# Patient Record
Sex: Male | Born: 1980 | Hispanic: No | Marital: Single | Smoking: Never smoker
Health system: Southern US, Community
[De-identification: ages and names within clinical notes are randomized; demographics above are authoritative.]

---

## 2014-06-09 ENCOUNTER — Encounter (HOSPITAL_COMMUNITY): Payer: Self-pay | Admitting: Emergency Medicine

## 2014-06-09 ENCOUNTER — Emergency Department (HOSPITAL_COMMUNITY)
Admission: EM | Admit: 2014-06-09 | Discharge: 2014-06-09 | Disposition: A | Discharge status: Home / Self Care | Payer: No Typology Code available for payment source | Attending: Emergency Medicine | Admitting: Emergency Medicine

## 2014-06-09 DIAGNOSIS — S0180XA Unspecified open wound of other part of head, initial encounter: Secondary | ICD-10-CM | POA: Diagnosis not present

## 2014-06-09 DIAGNOSIS — S0181XA Laceration without foreign body of other part of head, initial encounter: Secondary | ICD-10-CM

## 2014-06-09 DIAGNOSIS — Y92410 Unspecified street and highway as the place of occurrence of the external cause: Secondary | ICD-10-CM | POA: Insufficient documentation

## 2014-06-09 DIAGNOSIS — Y9389 Activity, other specified: Secondary | ICD-10-CM | POA: Insufficient documentation

## 2014-06-09 MED ORDER — LIDOCAINE HCL 1 % IJ SOLN
INTRAMUSCULAR | Status: AC
  Administered 2014-06-09: 06:00:00
  Filled 2014-06-09: qty 20

## 2014-06-09 MED ORDER — LIDOCAINE HCL 2 % IJ SOLN
INTRAMUSCULAR | Status: AC
  Filled 2014-06-09: qty 20
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
## 2014-06-19 ENCOUNTER — Emergency Department (HOSPITAL_COMMUNITY)
Admission: EM | Admit: 2014-06-19 | Discharge: 2014-06-20 | Disposition: A | Discharge status: Home / Self Care | Payer: Self-pay | Attending: Emergency Medicine | Admitting: Emergency Medicine

## 2014-06-19 ENCOUNTER — Encounter (HOSPITAL_COMMUNITY): Payer: Self-pay | Admitting: Emergency Medicine

## 2014-06-19 ENCOUNTER — Emergency Department (HOSPITAL_COMMUNITY): Payer: No Typology Code available for payment source

## 2014-06-19 DIAGNOSIS — S0181XA Laceration without foreign body of other part of head, initial encounter: Secondary | ICD-10-CM | POA: Insufficient documentation

## 2014-06-19 DIAGNOSIS — Z23 Encounter for immunization: Secondary | ICD-10-CM | POA: Insufficient documentation

## 2014-06-19 MED ORDER — TETANUS-DIPHTH-ACELL PERTUSSIS 5-2.5-18.5 LF-MCG/0.5 IM SUSP
0.5000 mL | Freq: Once | INTRAMUSCULAR | Status: AC
Start: 2014-06-19 — End: 2014-06-19
  Administered 2014-06-19: 0.5 mL via INTRAMUSCULAR
  Filled 2014-06-19: qty 0.5

## 2014-06-19 MED ORDER — LIDOCAINE HCL (PF) 1 % IJ SOLN
5.0000 mL | Freq: Once | INTRAMUSCULAR | Status: DC
  Filled 2014-06-19: qty 5

## 2014-06-19 MED ORDER — ACETAMINOPHEN 325 MG PO TABS
650.0000 mg | ORAL_TABLET | Freq: Once | ORAL | Status: AC
  Administered 2014-06-20: 650 mg via ORAL
  Filled 2014-06-19: qty 2
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
## 2014-06-19 NOTE — ED Notes (Signed)
MD at bedside. 

## 2014-06-19 NOTE — ED Notes (Signed)
[employer] at bedside 
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
## 2014-06-20 NOTE — ED Provider Notes (Signed)
I saw and evaluated the patient, reviewed the resident's note and I agree with the findings and plan.   .Face to face Exam:  General:  Awake HEENT:  Forehead laceration Resp:  Normal effort Abd:  Nondistended Neuro:No focal weakness Lymph: No adenopathy   Nelia Shiobert L Zaharah Amir, MD 06/20/14 0010

## 2014-06-20 NOTE — Discharge Instructions (Signed)
Ch?m [place] V?t Rch, Ng??i L?n (Laceration Care, Adult) V?t rch l m?t v?t c?t ho?c t?n th??ng lan r?ng qua t?t c? cc l?p c?a da v xuyn th?u xu?ng m d??i da.  ?I?U TR? M?t s? v?t rch c th? khng c?n ph?i khu l?i. M?t s? v?t rch c th? khng ?ng ???c do t?ng nguy c? b? nhi?m trng. B?n c?n g?p chuyn gia ch?m [place] s?c kh?e c?a mnh cng s?m cng t?t sau khi b? th??ng ?? gi?m thi?u nguy c? nhi?m trng v t?i ?a ha kh? n?ng khu v?t th??ng thnh cng. N?u khu l thch h?p, thu?c gi?m ?au c th? ???c ch? ??nh n?u c?n thi?t. V?t th??ng s? ???c lm s?ch ?? trnh nhi?m trng. Chuyn gia ch?m [place] s?c kh?e c?a b?n s? s? d?ng cc m?i khu (ch? khu), ghim khu, keo dn v?t th??ng (ch?t k?t dnh) ho?c d?i b?ng dnh da ?? s?a ch?a v?t rch. Nh?ng cng c? ??a cc mp da l?i g?n nhau ?? ph?c h?i nhanh h?n v k?t qu? th?m m? t?t h?n. Tuy nhin, t?t c? cc v?t th??ng s? lnh km theo m?t v?t s?o. Sau khi v?t th??ng ? lnh, s?o c th? ???c gi?m thi?u b?ng cch thoa kem ch?ng n?ng ln v?t th??ng vo ban ngy, trong su?t 1 n?m. H??NG D?N CH?M [place] T?I NH ??i v?i ch? khu ho?c ghim khu:  Gi? cho v?t th??ng s?ch v kh.  N?u ???c b?ng, b?n nn thay b?ng t nh?t m?t l?n m?i ngy. C?ng c?n thay b?ng n?u b?ng ??t ho?c b?n, ho?c theo ch? d?n c?a chuyn gia ch?m [place] s?c kh?e.  R?a v?t th??ng b?ng x phng v n??c 2 l?n m?i ngy. R?a s?ch v?t th??ng b?ng n??c ?? lo?i b? t?t c? x phng. Th?m kh v?t th??ng b?ng kh?n s?ch.  Sau khi lm s?ch, hy bi m?t l?p m?ng thu?c m? khng sinh theo khuy?n co c?a chuyn gia ch?m [place] s?c kh?e. Lm nh? v?y s? gip ng?n ng?a nhi?m trng v gi? b?ng khng b? dnh vo v?t th??ng.  B?n c th? t?m nh? bnh th??ng sau 24 gi? ??u tin. Khng ngm v?t th??ng trong n??c cho ??n khi tho ch? khu.  Ch? s? d?ng thu?c khng c?n k toa ho?c thu?c c?n k toa ?? gi?m ?au, gi?m c?m gic kh ch?u ho?c h? s?t theo ch? d?n c?a chuyn gia ch?m [place] s?c kh?e c?a b?n.  C?t ch? ho?c tho b? ghim  khu theo ch? d?n c?a chuyn gia ch?m  s?c kh?e. ??i v?i d?i b?ng dnh da:  Gi? cho v?t th??ng s?ch v kh.  Khng lm d?i b?ng dnh da b? ??t. B?n c th? t?m m?t cch c?n th?n ?? gi? cho v?t th??ng kh.  N?u v?t th??ng b? ??t, hy th?m kh b?ng kh?n s?ch.  D?i b?ng dnh da s? t? r?i ra. B?n c th? c?t d?i b?ng khi v?t th??ng lnh. Khng tho d?i b?ng dnh da v?n cn b? dnh vo v?t th??ng. Chng s? r?i ra khi ??n lc. V?i keo dnh v?t th??ng:  B?n c th? lm ??t v?t th??ng d??i vi sen ho?c trong t?m b?n trong giy lt. Khng ngm ho?c ch v?t th??ng. Khng b?i. Trnh nh?ng giai ?o?n ?? m? hi nhi?u cho ??n khi b?ng dnh da t? r?i ra. Sau khi t?m, hy nh? nhng th?m kh v?t th??ng b?ng kh?n s?ch.  Khng bi thu?c n??c, thu?c kem ho?c thu?c m? vo v?t th??ng trong  khi b?ng dnh da v?n cn. Lm nh? v?y c th? l?ng l?p phim tr??c khi v?t th??ng ???c ch?a lnh.  N?u v?t th??ng ???c b?ng, hy c?n th?n khng b?ng tr?c ti?p ln trn ch?t k?t dnh da. Lm nh? v?y c th? khi?n cho ch?t k?t dnh b? r?i ra tr??c khi v?t th??ng ???c ch?a lnh.  Trnh ti?p xc ko di v?i nh sng m?t tr?i ho?c ?n nhu?m da trong khi cn ch?t k?t dnh da. Ti?p xc v?i tia c?c tm trong n?m ??u tin s? lm thm v?t s?o.  Ch?t k?t dnh da th??ng s? v?n ???c gi? nguyn t?i ch? trong 5 ??n 10 ngy, sau ? t? r?i ra kh?i da. Khng ch?c vo l?p phim k?t dnh. B?n c th? c?n ???c tim phng u?n vn n?u:  B?n khng th? nh? l?n tim phng u?n vn g?n ?y nh?t c?a b?n l khi no.  B?n ch?a bao gi? tim phng u?n vn. N?u b?n ???c tim phng u?n vn, cnh tay c?a b?n c th? b? s?ng, ?? v c?m th?y ?m khi ch?m vo. ?i?u ny l ph? bi?n v khng ph?i l m?t v?n ??. N?u b?n c?n tim phng u?n vn v b?n ch?n khng tim, s? c nguy c? b? u?n vn, tuy hi?m hoi. B?nh do u?n vn c th? nghim tr?ng. HY ?I KHM N?U:  B?n b? t?y ??, s?ng ho?c ?au ngy cng t?ng trong v?t th??ng.  B?n th?y m?t ???ng mu ?? m xu?t pht t? v?t  th??ng.  C ch?t l?ng mu vng tr?ng (m?) ch?y ra t? v?t th??ng c?a b?n.  B?n b? s?t.  B?n th?y mi hi thot ra t? v?t th??ng ho?c b?ng.  V?t th??ng c?a b?n b? h mi?ng tr??c ho?c sau khi tho ch?.  B?n nh?n th?y c ci g ? r?i ra t? v?t th??ng nh? g? ho?c th?y tinh.  V?t th??ng n?m trn bn tay ho?c bn chn c?a b?n v b?n khng th? di chuy?n m?t ngn tay ho?c ngn chn. HY NGAY L?P T?C ?I KHM N?U:  B?n b? ?au khng ki?m sot ???c b?ng thu?c ???c k ??n.  B?n b? s?ng n?ng xung quanh v?t th??ng gy ?au ??n v t ho?c thay ??i mu s?c trong cnh tay, tay, chn ho?c bn chn.  V?t th??ng c?a b?n b? h mi?ng v b?t ??u ch?y mu.  B?n b? t, y?u ngy m?t t? ho?c m?t ch?c n?ng c?a b?t k? kh?p no quanh ho?c bn ngoi v?t th??ng.  B?n c cc kh?i u gy ?au ??n g?n v?t th??ng ho?c trn b?t c? ch? da no trn c? th?. ??M B?O B?N:  Hi?u cc h??ng d?n ny.  S? theo di tnh tr?ng c?a mnh.  S? yu c?u tr? gip ngay l?p t?c n?u b?n c?m th?y khng ?? ho?c tnh tr?ng tr?m tr?ng h?n. Document Released: 08/27/2005 Document Revised: 04/29/2013 [facility] Patient Information 2015 [employer] [place], [place]. This information is not intended to replace advice given to you by your health care provider. Make sure you discuss any questions you have with your health care provider.

## 2014-06-30 ENCOUNTER — Emergency Department (HOSPITAL_COMMUNITY)
Admission: EM | Admit: 2014-06-30 | Discharge: 2014-06-30 | Disposition: A | Discharge status: Home / Self Care | Payer: Self-pay | Attending: Emergency Medicine | Admitting: Emergency Medicine

## 2014-06-30 ENCOUNTER — Encounter (HOSPITAL_COMMUNITY): Payer: Self-pay | Admitting: Emergency Medicine

## 2014-06-30 DIAGNOSIS — Z4802 Encounter for removal of sutures: Secondary | ICD-10-CM | POA: Insufficient documentation

## 2014-06-30 NOTE — ED Notes (Signed)
Patient states here for suture removal.  No s/s of infection.  Patient states area is still tender.

## 2014-06-30 NOTE — ED Provider Notes (Signed)
CSN: [id number]     Arrival date & time 06/30/14  [id number] History  This chart was scribed for non-physician practitioner, Javier SealLauren M Loreley Schwall, PA-C, working with Derwood KaplanAnkit Nanavati, MD by Javier BillsEssence Todd, ED Scribe. This patient was seen in room TR05C/TR05C and the patient's care was started at 11:13 AM.   Chief Complaint  Patient presents with  . Suture / Staple Removal   HPI Comments: Javier Todd is a 33 y.o. male who presents to the Emergency Department for suture removal. Pt had 4 sutures placed to forehead on 06/19/14. Tdap was also given on 06/19/14. He denies fever, chills.   The history is provided by the patient. No language interpreter was used.   History reviewed. No pertinent past medical history. History reviewed. No pertinent past surgical history. No family history on file. History  Substance Use Topics  . Smoking status: Never Smoker   . Smokeless tobacco: Not on file  . Alcohol Use: Yes    Review of Systems  Constitutional: Negative for fever and chills.  Skin: Positive for wound.  All other systems reviewed and are negative.  Allergies  Review of patient's allergies indicates no known allergies.  Home Medications   Prior to Admission medications   Not on File   Triage Vitals: BP 106/74  Pulse 61  Temp(Src) 97.6 F (36.4 C) (Oral)  Resp 16  Ht 5\' 4"  (1.626 m)  Wt 120 lb (54.432 kg)  BMI 20.59 kg/m2  SpO2 100% Physical Exam  Nursing note and vitals reviewed. Constitutional: He is oriented to person, place, and time. He appears well-developed and well-nourished. No distress.  HENT:  Head: Normocephalic.  Healing wound to left for head. No active drainage or surrounding erythema. 4 intact sutures  Eyes: Conjunctivae and EOM are normal.  Neck: Neck supple.  Pulmonary/Chest: Effort normal. No respiratory distress.  Musculoskeletal: Normal range of motion.  Neurological: He is alert and oriented to person, place, and time.  Skin: Skin is warm and dry.  Psychiatric:  He has a normal mood and affect. His behavior is normal.   ED Course  SUTURE REMOVAL Date/Time: 06/30/2014 10:47 AM Performed by: Mellody DrownPARKER, Chas Axel Authorized by: Mellody DrownPARKER, Kellyn Mccary Consent: Verbal consent obtained. Risks and benefits: risks, benefits and alternatives were discussed Consent given by: patient Patient understanding: patient states understanding of the procedure being performed Patient consent: the patient's understanding of the procedure matches consent given Procedure consent: procedure consent matches procedure scheduled Required items: required blood products, implants, devices, and special equipment available Patient identity confirmed: arm band and verbally with patient Time out: Immediately prior to procedure a "time out" was called to verify the correct patient, procedure, equipment, support staff and site/side marked as required. Body area: head/neck Location details: left eyebrow Wound Appearance: clean Sutures Removed: 4 Facility: sutures placed in this facility Patient tolerance: Patient tolerated the procedure well with no immediate complications.   (including critical care time) DIAGNOSTIC STUDIES: Oxygen Saturation is 100% on RA, normal by my interpretation.    COORDINATION OF CARE: 11:17 AM-Discussed treatment plan which includes suture removal with pt at bedside and pt agreed to plan.   Labs Review Labs Reviewed - No data to display  Imaging Review No results found.   EKG Interpretation None      MDM   Final diagnoses:  Visit for suture removal   Visit for suture removal, afebrile, no sign of infection. Patient tolerated procedure well. Tetanus updated last encounter.  I personally performed the services described in this  documentation, which was scribed in my presence. The recorded information has been reviewed and is accurate.    Mellody DrownLauren Shayda Kalka, PA-C 06/30/14 1249

## 2014-06-30 NOTE — ED Notes (Signed)
Patient understands some English.  For full conversation, will have to use interpreter phone [place] language.

## 2014-06-30 NOTE — Discharge Instructions (Signed)
Call for a follow up appointment with a Family or Primary Care Provider.  Return if Symptoms worsen.   Take medication as prescribed.  Keep the wound clean and dried. Mederma is the medication that you inquired about during your encounter. Bacitracin is the other medication that you can use.    Emergency Department Resource Guide 1) Find a Doctor and Pay Out of Pocket Although you won't have to find out who is covered by your insurance plan, it is a good idea to ask around and get recommendations. You will then need to call the office and see if the doctor you have chosen will accept you as a new patient and what types of options they offer for patients who are self-pay. Some doctors offer discounts or will set up payment plans for their patients who do not have insurance, but you will need to ask so you aren't surprised when you get to your appointment.  2) Contact Your Local Health Department Not all health departments have doctors that can see patients for sick visits, but many do, so it is worth a call to see if yours does. If you don't know where your local health department is, you can check in your phone book. The CDC also has a tool to help you locate your state's health department, and many state websites also have listings of all of their local health departments.  3) Find a Walk-in Clinic If your illness is not likely to be very severe or complicated, you may want to try a walk in clinic. These are popping up all over the country in pharmacies, drugstores, and shopping centers. They're usually staffed by nurse practitioners or physician assistants that have been trained to treat common illnesses and complaints. They're usually fairly quick and inexpensive. However, if you have serious medical issues or chronic medical problems, these are probably not your best option.  No Primary Care Doctor: - Call Health Connect at  [phone] - they can help you locate a primary care doctor that   accepts your insurance, provides certain services, etc. - Physician Referral Service- [phone]  Chronic Pain Problems: Organization         Address  Phone   Notes  [facility]  [phone] Patients need to be referred by their primary care doctor.   Medication Assistance: Organization         Address  Phone   Notes  [facility] [address] [place], [place] [address] [phone] --Must be a resident of [place] -- Must have NO insurance coverage whatsoever (no Medicaid/ Medicare, etc.) -- The pt. MUST have a primary care doctor that directs their care regularly and follows them in the community   [facility]  [phone]   [employer]  [phone]) [phone]    Agencies that provide inexpensive medical care: Organization         Address  Phone   Notes  [facility]  [phone]   [facility]    [phone]   [facility] [address] [place], [place] [address] [phone]   [facility] [address], [place] [phone]) [phone]   [facility]    [phone]) [phone]   [facility]    [phone]   [facility]  [address], [place] Phone:  [phone], Fax:  [phone] Hours of Operation:  9 am - 6 pm, M-F.  Also accepts Medicaid/Medicare and self-pay.  °[facility] ° [address], [place] Phone: [phone], Fax: [phone]. Hours of Operation:  8:30 am - 5:30 pm, M-F.  Also accepts Medicaid and self-pay.  °[facility] [place] [address], [place] Phone: [phone]   °[facility] [address], [place], [place] ([phone], Ext. 123 Mondays & Thursdays: 7-9 AM.  First 15 patients are seen on a first come, first serve basis. °  ° °Medicaid-accepting [place] Providers: ° °Organization          Address  Phone   Notes  °[facility] [address], [place] [phone] Also accepts self-pay patients.  °[facility] [address], [place] ° ([phone]) [phone]   °[facility] [address], [place] [phone]) [phone]   °[facility] [address] [place] [address], [place] ([phone]) [phone]   °Veita Bland [address], [place]  ° [phone]) [phone] Only accepts [place] Access Medicaid patients after they have their name applied to their card.  ° °Self-Pay (no insurance) in [place]: ° °Organization         Address  Phone   Notes  °Sickle Cell Patients, [facility] [address], [place] [phone]) [phone]   °[facility] [address], [place] ([phone]) [phone]   °[facility] [place] ° [address], [place] [phone]) [phone]   °[facility]/Dr. Osei-Bonsu ° [address] [place] [address], [place] or [address], [place] [phone]) [phone] Phone number for both [place] and [place] locations is the same.  °[facility] [address], [place] ([phone]) [phone]   °[facility] [address] [place] [address], [place] or [address] ([phone]) [phone] °([phone]) [phone]   °[facility] [address], [place] (336) [phone], phone; [phone], fax Sees patients 1st and 3rd Saturday of every month.  Must not qualify for public or private insurance (i.e. Medicaid, Medicare, [place] Health Choice, Veterans' Benefits) • Household income should be no more than 200% of the poverty level •The clinic cannot treat you if you are pregnant or think you are pregnant • Sexually transmitted diseases are not treated at the clinic.  ° ° °Dental Care: °Organization         Address  Phone  Notes  °[place] [facility] [address],  [place] ([phone]) [phone] Accepts children up to age 21 who are enrolled in Medicaid or [place] Health Choice; pregnant women with a Medicaid card; and children who have applied for Medicaid or [place] Health Choice, but were declined, whose parents can pay a reduced fee at time of service.  °[place] [facility] [place]  [address], [place] [phone]) [phone] Accepts children up to age 21 who are enrolled in Medicaid or [place] Health Choice; pregnant women with a Medicaid card; and children who have applied for Medicaid or  Health Choice, but were declined, whose parents can pay a reduced fee at time of service.  °[facility] ° [address], [place] ([phone]) [phone] Patients are seen by appointment only. Walk-ins are not accepted. [facility] will see patients 18 years   of age and older. °Monday - Tuesday (8am-5pm) °Most Wednesdays (8:30-5pm) °$30 per visit, cash only  °[facility] ° [address], [place] [phone]) [phone] Patients are seen by appointment only. Walk-ins are not accepted. [facility] will see patients 18 years of age and older. °One Wednesday Evening (Monthly: Volunteer Based).  $30 per visit, cash only  °[facility]  [phone]) [phone] for adults; Children under age 4, call Graduate Pediatric Dentistry at [phone]. Children aged 4-14, please call [phone] to request a pediatric application. ° Dental services are provided in all areas of dental care including fillings, crowns and bridges, complete and partial dentures, implants, gum treatment, root canals, and extractions. Preventive care is also provided. Treatment is provided to both adults and children. °Patients are selected via a lottery and there is often a waiting list. °  °[facility] [address], °[place] ° ([phone]) [phone] [email or web address] °  °[facility] [address], [place], [place]  (336)[phone], Ext. 123 Second and Fourth Thursday of each month, opens at 6:30 AM; Clinic ends at 9 AM.  Patients are seen on a first-come first-served basis, and a limited number are seen during each clinic.  ° °[facility] ° [address], [place], [place] [phone]) [phone]   Eligibility Requirements °You must have lived in [place], [place], or [place] counties for at least the last three months. °  You cannot be eligible for state or federal sponsored healthcare insurance, including Veterans Administration, Medicaid, or Medicare. °  You generally cannot be eligible for healthcare insurance through your employer.  °  How to apply: °Eligibility screenings are held every Tuesday and Wednesday afternoon from 1:00 pm until 4:00 pm. You do not need an appointment for the interview!  °[facility] [address], [place], [place] [phone]   °[facility]  [phone]   °[facility]  336-703-3100   °[facility]  336-570-6415   ° °Behavioral Health Resources in the Community: °Intensive Outpatient Programs °Organization         Address  Phone  Notes  °[facility] [address], [place], [place] [phone]   °[facility] [address], [place], [place] [phone]   °[facility] [address], [place], [place] ° 336-882-2125   °[facility] [address],  °[place], [place] 1-800-853-5163 or 336-641-4981   °Substance Abuse Resources °Organization         Address  Phone  Notes  °[facility]  336-882-2125   °[facility]  336-784-9470   °[facility]  336-285-9073   °[facility]  336-845-3988   °Residential & Outpatient Substance Abuse Program  1-800-659-3381   °Psychological Services °Organization         Address  Phone  Notes  °[facility]  336- [phone]   °[facility]  336- 378-7881    °[facility] [address], [place] 1-800-853-5163 or 336-641-4981   ° °Mobile Crisis Teams °Organization         Address  Phone  Notes  °[facility], Mobile Crisis Care Unit  1-877-626-1772   °Assertive °[facility] ° [address] [place], [place] 336-834-9664   °Sharon DeEsch [address] °[place]  336-554-5454   ° °Self-Help/Support Groups °Organization           Address  Phone             Notes  [place] [facility] [place] - variety of support groups  [place] Call for more information  Narcotics Anonymous (NA), [facility] [address], [employer] [place]  2 meetings at this location   Special educational needs teacher         Address  Phone  Notes  [facility] [place],    [place]  1-(224)335-7018   [facility] [place] [facility]  [address] [place] [address], [place], [place]   [place], [place] [phone] Admissions: 8am-3pm M-F  [facility]stance [place] [address]    [place], [place] [phone]   [facility] [address] [place], [place], [place]   [facility] [address]  [place], [place]   [facility] - Intensive Outpatient [address], [place], [place]   [facility] ([place] [address]  [place], [place] 1-360-145-2736 or [phone]   [facility] ([facility]) [address] [place], [place] Accepts Medicaid  [facility] [address]  [place] 1-226-409-9984 Substance Abuse/Addiction Treatment   [facility]         Address  Phone  Notes  [facility]  7097276150   Domenic Schwab, PhD [address] Arlis [place], [place]   (731) [phone] or (505) [phone]   [place], [place] (413)478-3934   [facility] [address], [place], [place] [phone] Insurance/Medicaid/sponsorship through [facility] and Families [address] [place]                                    [place], [place] [phone] [place] [address], [place] [phone]    Dr. Adele Schilder  [phone]   [facility] Dept. 1) [address] [place] 2) [place] 3)  [address], [place] [phone]  [phone]   [place] [phone] or [phone] (After Hours)
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
## 2015-02-12 ENCOUNTER — Emergency Department (HOSPITAL_BASED_OUTPATIENT_CLINIC_OR_DEPARTMENT_OTHER): Payer: Self-pay

## 2015-02-12 ENCOUNTER — Emergency Department (HOSPITAL_BASED_OUTPATIENT_CLINIC_OR_DEPARTMENT_OTHER)
Admission: EM | Admit: 2015-02-12 | Discharge: 2015-02-12 | Disposition: A | Discharge status: Home / Self Care | Payer: Self-pay | Attending: Emergency Medicine | Admitting: Emergency Medicine

## 2015-02-12 ENCOUNTER — Encounter (HOSPITAL_BASED_OUTPATIENT_CLINIC_OR_DEPARTMENT_OTHER): Payer: Self-pay | Admitting: Emergency Medicine

## 2015-02-12 DIAGNOSIS — S01511A Laceration without foreign body of lip, initial encounter: Secondary | ICD-10-CM | POA: Insufficient documentation

## 2015-02-12 DIAGNOSIS — Y9389 Activity, other specified: Secondary | ICD-10-CM | POA: Insufficient documentation

## 2015-02-12 DIAGNOSIS — Y998 Other external cause status: Secondary | ICD-10-CM | POA: Insufficient documentation

## 2015-02-12 DIAGNOSIS — Y9289 Other specified places as the place of occurrence of the external cause: Secondary | ICD-10-CM | POA: Insufficient documentation

## 2015-02-12 MED ORDER — LIDOCAINE HCL 2 % IJ SOLN
INTRAMUSCULAR | Status: AC
  Filled 2015-02-12: qty 20
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
## 2015-02-12 NOTE — Discharge Instructions (Signed)
Facial Laceration °A facial laceration is a cut on the face. These injuries can be painful and cause bleeding. Some cuts may need to be closed with stitches (sutures), skin adhesive strips, or wound glue. Cuts usually heal quickly but can leave a scar. It can take 1-2 years for the scar to go away completely. °HOME CARE  °· Only take medicines as told by your doctor. °· Follow your doctor's instructions for wound care. °For Stitches: °· Keep the cut clean and dry. °· If you have a bandage (dressing), change it at least once a day. Change the bandage if it gets wet or dirty, or as told by your doctor. °· Wash the cut with soap and water 2 times a day. Rinse the cut with water. Pat it dry with a clean towel. °· Put a thin layer of medicated cream on the cut as told by your doctor. °· You may shower after the first 24 hours. Do not soak the cut in water until the stitches are removed. °· Have your stitches removed as told by your doctor. °· Do not wear any makeup until a few days after your stitches are removed. °For Skin Adhesive Strips: °· Keep the cut clean and dry. °· Do not get the strips wet. You may take a bath, but be careful to keep the cut dry. °· If the cut gets wet, pat it dry with a clean towel. °· The strips will fall off on their own. Do not remove the strips that are still stuck to the cut. °For Wound Glue: °· You may shower or take baths. Do not soak or scrub the cut. Do not swim. Avoid heavy sweating until the glue falls off on its own. After a shower or bath, pat the cut dry with a clean towel. °· Do not put medicine or makeup on your cut until the glue falls off. °· If you have a bandage, do not put tape over the glue. °· Avoid lots of sunlight or tanning lamps until the glue falls off. °· The glue will fall off on its own in 5-10 days. Do not pick at the glue. °After Healing: °Put sunscreen on the cut for the first year to reduce your scar. °GET HELP RIGHT AWAY IF:  °· Your cut area gets red,  painful, or puffy (swollen). °· You see a yellowish-white fluid (pus) coming from the cut. °· You have chills or a fever. °MAKE SURE YOU:  °· Understand these instructions. °· Will watch your condition. °· Will get help right away if you are not doing well or get worse. °Document Released: 02/13/2008 Document Revised: 06/17/2013 Document Reviewed: 04/09/2013 °ExitCare® Patient Information ©2015 [employer]. This information is not intended to replace advice given to you by your health care provider. Make sure you discuss any questions you have with your health care provider. ° °
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
## 2023-03-24 ENCOUNTER — Emergency Department (HOSPITAL_COMMUNITY)
Admission: EM | Admit: 2023-03-24 | Discharge: 2023-03-24 | Disposition: A | Discharge status: Home / Self Care | Payer: Medicaid Other | Attending: Emergency Medicine | Admitting: Emergency Medicine

## 2023-03-24 ENCOUNTER — Emergency Department (HOSPITAL_COMMUNITY): Payer: Medicaid Other

## 2023-03-24 ENCOUNTER — Encounter (HOSPITAL_COMMUNITY): Payer: Self-pay

## 2023-03-24 ENCOUNTER — Other Ambulatory Visit: Payer: Self-pay

## 2023-03-24 DIAGNOSIS — T402X1A Poisoning by other opioids, accidental (unintentional), initial encounter: Secondary | ICD-10-CM | POA: Diagnosis present

## 2023-03-24 DIAGNOSIS — T40601A Poisoning by unspecified narcotics, accidental (unintentional), initial encounter: Secondary | ICD-10-CM

## 2023-03-24 NOTE — ED Notes (Addendum)
2L O2 via n/c applied to pt, SpO2 was at 86% on RA.
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
## 2023-03-24 NOTE — ED Provider Notes (Signed)
[place] EMERGENCY DEPARTMENT AT [facility] Provider Note   CSN: [id number] Arrival date & time: 03/24/23  1658     History  Chief Complaint  Patient presents with   OD    Javier Todd is a 42 y.o. male.  HPI    42 year old male comes in with chief complaint of overdose. According to EMS, patient's wife called EMS because patient was unresponsive.  When they arrived patient was having agonal respirations, was hypoxic and he had pinpoint pupils.  They gave him Narcan and patient became responsive.  He received 2 mg of Narcan IV.  Patient currently alert and awake.  He is asking where his wife is.  He is unsure how he got to the emergency room.  He indicates to me that he had smoked something that " black man he knows" gave him.  He denies any previous history of drug use.  I called patient's home phone, and there was no response.  Patient denies any past medical history.  Home Medications Prior to Admission medications   Not on File      Allergies    Patient has no known allergies.    Review of Systems   Review of Systems  All other systems reviewed and are negative.   Physical Exam Updated Vital Signs BP 121/69 (BP Location: Right Arm)   Pulse 85   Temp 97.9 F (36.6 C) (Oral)   Resp (!) 22   SpO2 97%  Physical Exam Vitals and nursing note reviewed.  Constitutional:      Appearance: He is well-developed.  HENT:     Head: Atraumatic.  Eyes:     Extraocular Movements: Extraocular movements intact.     Pupils: Pupils are equal, round, and reactive to light.     Comments: Pupils are 3 mm and equal bilaterally, no nystagmus  Cardiovascular:     Rate and Rhythm: Normal rate.  Pulmonary:     Effort: Pulmonary effort is normal.  Musculoskeletal:     Cervical back: Neck supple.  Skin:    General: Skin is warm.  Neurological:     Mental Status: He is alert and oriented to person, place, and time.     Cranial Nerves: No cranial nerve deficit.      Motor: No weakness.     ED Results / Procedures / Treatments   Labs (all labs ordered are listed, but only abnormal results are displayed) Labs Reviewed - No data to display  EKG None  Radiology DG Chest [facility] 1 View  Result Date: 03/24/2023 CLINICAL DATA:  Shortness of breath EXAM: PORTABLE CHEST 1 VIEW COMPARISON:  None Available. FINDINGS: Transverse diameter of heart is slightly increased. There are no signs of pulmonary edema or focal pulmonary consolidation. There is no pleural effusion or pneumothorax. IMPRESSION: No active cardiopulmonary disease. Electronically Signed   By: Ernie Avena M.D.   On: 03/24/2023 19:12    Procedures .Critical Care  Performed by: Derwood Kaplan, MD Authorized by: Derwood Kaplan, MD   Critical care provider statement:    Critical care time (minutes):  30   Critical care was necessary to treat or prevent imminent or life-threatening deterioration of the following conditions:  Toxidrome   Critical care was time spent personally by me on the following activities:  Development of treatment plan with patient or surrogate, discussions with consultants, evaluation of patient's response to treatment, examination of patient, ordering and review of laboratory studies, ordering and review of radiographic studies, ordering  and performing treatments and interventions, pulse oximetry, re-evaluation of patient's condition, review of old charts and obtaining history from patient or surrogate     Medications Ordered in ED Medications - No data to display  ED Course/ Medical Decision Making/ A&P                             Medical Decision Making Amount and/or Complexity of Data Reviewed Radiology: ordered.   This patient presents to the ED with chief complaint(s) of acute altered mental status/unresponsiveness, respiratory depression and hypoxia with no pertinent past medical history.patient received Narcan became more alert.  He is still confused,  but easily arousable and answers all questions appropriately . the complaint involves an extensive differential diagnosis and also carries with it a high risk of complications and morbidity.    The differential diagnosis includes : Opiate overdose, likely accidental in this case, alcohol use disorder, possible aspiration, anoxic brain injury  The initial plan is to monitor patient in the emergency room.  He has no focal neurodeficits.  He is answering questions appropriately at this time.  Confusion likely result of overdose.  If he does well in the emergency room, we are able to wean him off of oxygen and then he can be discharged.  Additional history obtained: Additional history obtained from EMS    Independent visualization and interpretation of imaging: - I independently visualized the following imaging with scope of interpretation limited to determining acute life threatening conditions related to emergency care: X-ray of the chest, which revealed no evidence of aspiration  Treatment and Reassessment: Patient reassessed an hour #2 an hour #4 after ED arrival.  An hour #2 we discontinued his oxygen.  Patient continued to saturate well.  He is still alert.  He is stable for discharge.  He will require cab Voucher, as there is some language barrier and we are unable to get in touch with family who can pick him up safely.  Patient advised not to use any drugs.  Final Clinical Impression(s) / ED Diagnoses Final diagnoses:  Opiate overdose, accidental or unintentional, initial encounter [facility])    Rx / DC Orders ED Discharge Orders     None         Derwood Kaplan, MD 03/25/23 1102

## 2023-03-24 NOTE — ED Triage Notes (Signed)
Pt BIB [facility] from home where wife found him in the living room floor unconscious, pin point pupils & agonal breathing for 30 minutes before EMS arrived. EMS reports 60% SpO2 on RA before they started bagging him, upon arrival to ED his SpO2 was 96% on RA after he woke up once 10 minutes after receiving 2 mg Narcan via 16g Lt FA PIV. 123/82, 90 bpm, CBG 295, ST on their monitor. Alert & confused upon arrival.

## 2023-03-24 NOTE — Discharge Instructions (Signed)
It appears that you accidentally overdosed on opiate. Please avoid opioid usage in the future, as it has the potential to kill you.
